# Patient Record
Sex: Female | Born: 1990 | Race: White | Hispanic: No | Marital: Married
Health system: Southern US, Community
[De-identification: ages and names within clinical notes are randomized; demographics above are authoritative.]

---

## 2013-05-10 ENCOUNTER — Other Ambulatory Visit: Payer: Self-pay

## 2013-06-11 ENCOUNTER — Other Ambulatory Visit (HOSPITAL_COMMUNITY): Payer: Self-pay | Admitting: Specialist

## 2013-06-11 DIAGNOSIS — O289 Unspecified abnormal findings on antenatal screening of mother: Secondary | ICD-10-CM

## 2013-06-14 ENCOUNTER — Encounter (HOSPITAL_COMMUNITY): Payer: Self-pay | Admitting: Specialist

## 2013-06-22 ENCOUNTER — Encounter (HOSPITAL_COMMUNITY): Payer: Self-pay

## 2013-06-22 ENCOUNTER — Ambulatory Visit (HOSPITAL_COMMUNITY)
Admission: RE | Admit: 2013-06-22 | Discharge: 2013-06-22 | Disposition: A | Discharge status: Home / Self Care | Payer: BC Managed Care – PPO | Source: Ambulatory Visit | Attending: Specialist | Admitting: Specialist

## 2013-06-22 VITALS — BP 135/89 | HR 115 | Wt 257.0 lb

## 2013-06-22 DIAGNOSIS — O289 Unspecified abnormal findings on antenatal screening of mother: Secondary | ICD-10-CM | POA: Insufficient documentation

## 2013-06-22 DIAGNOSIS — O358XX0 Maternal care for other (suspected) fetal abnormality and damage, not applicable or unspecified: Secondary | ICD-10-CM | POA: Insufficient documentation

## 2013-06-22 DIAGNOSIS — Z1389 Encounter for screening for other disorder: Secondary | ICD-10-CM | POA: Insufficient documentation

## 2013-06-22 DIAGNOSIS — Z363 Encounter for antenatal screening for malformations: Secondary | ICD-10-CM | POA: Insufficient documentation

## 2013-06-22 NOTE — ED Notes (Signed)
°  Genetic Counseling  DOB: May 25, 1990 Referring Provider: Tressie EllisBroach, William, MD Appointment Date: 06/22/2013 Attending:  Dr. Alpha GulaPaul Whitecar  [name] and her partner, Mr. Hailey Jackson, were seen for genetic counseling because of an increased risk for fetal Down syndrome based on a maternal serum Quad screen. [name]'s mother was also present.  They were counseled regarding the Quad screen result and the associated 1 in 112 risk for fetal Down syndrome.  We reviewed chromosomes, nondisjunction, and the common features and variable prognosis of Down syndrome.  In addition, we reviewed the screen adjusted reduction in risks for trisomy 18 and ONTDs.  We also discussed other explanations for a screen positive result including: a gestational dating error, differences in maternal metabolism, and normal variation. They understand that this screening is not diagnostic for Down syndrome but provides a risk assessment.  We reviewed available screening options including noninvasive prenatal screening (NIPS)/cell free fetal DNA (cffDNA) testing, and detailed ultrasound.  They were counseled that screening tests are used to modify a patient's a priori risk for aneuploidy, typically based on age. This estimate provides a pregnancy specific risk assessment. We reviewed the benefits and limitations of each option. Specifically, we discussed the conditions for which each test screens, the detection rates, and false positive rates of each. They were also counseled regarding diagnostic testing via amniocentesis. We reviewed the approximate 1 in 300-500 risk for complications for amniocentesis, including spontaneous pregnancy loss.   [name] also had a detailed ultrasound performed today.  The ultrasound report will be sent under separate cover. We discussed that there were no visualized fetal anomalies or markers suggestive of aneuploidy.  She was counseled that this can reduce the chance for Down syndrome  in the pregnancy by up to 50%.  NIPS and diagnostic testing were declined today.  [name] stated that she was not concerned about the baby having Down syndrome and did not feel a need to be more certain than we are at this time.  They understand that screening tests cannot rule out all birth defects or genetic syndromes. The patient was advised of this limitation and states she still does not want additional testing at this time.   In addition, we discussed that the DIA MoM value was atypically elevated at 4.17 MoMs.  They were counseled that elevations in DIA are associated with an increased risk for adverse outcomes including: PIH, preeclampsia, poor fetal growth, and preterm delivery. We discussed the recommendation for increased surveillance.   [name] was provided with written information regarding cystic fibrosis (CF) including the carrier frequency and incidence in the Caucasian population, the availability of carrier testing and prenatal diagnosis if indicated.  In addition, we discussed that CF is routinely screened for as part of the [place] newborn screening panel.  She declined testing today.   Both family histories were reviewed and found to be noncontributory. Without further information regarding the provided family history, an accurate genetic risk cannot be calculated. Further genetic counseling is warranted if more information is obtained.  [name] denied exposure to environmental toxins or chemical agents. She denied the use of alcohol, tobacco or street drugs. She denied significant viral illnesses during the course of her pregnancy. Her medical and surgical histories were noncontributory.   I counseled this couple for approximately 40 minutes regarding the above risks and available options.   Mady Gemmaaragh Conrad, MS,  Certified Genetic Counselor

## 2013-07-20 ENCOUNTER — Encounter (HOSPITAL_COMMUNITY): Payer: Self-pay

## 2013-07-20 ENCOUNTER — Ambulatory Visit (HOSPITAL_COMMUNITY)
Admission: RE | Admit: 2013-07-20 | Discharge: 2013-07-20 | Disposition: A | Discharge status: Home / Self Care | Payer: BC Managed Care – PPO | Source: Ambulatory Visit | Attending: Specialist | Admitting: Specialist

## 2013-07-20 DIAGNOSIS — O289 Unspecified abnormal findings on antenatal screening of mother: Secondary | ICD-10-CM | POA: Insufficient documentation

## 2013-07-20 DIAGNOSIS — Z3689 Encounter for other specified antenatal screening: Secondary | ICD-10-CM | POA: Insufficient documentation

## 2013-07-20 NOTE — Progress Notes (Signed)
[facility]  Indication: 23 yr old G1P0 at 2050w2d with increased risk of trisomy 6521 of 1:112 on quad screen and elevated inhibin of 4.17MoM for follow up ultrasound to complete anatomic survey.  Findings: 1. Single intrauterine pregnancy. 2. Estimated fetal weight is in the 26th%. 3. Anterior placenta without evidence of previa. 4. Normal amniotic fluid volume. 5. Normal transabdominal cervical length. 6. The limited anatomy survey is normal. Any anatomy not evaluated on today's exam was evaluated on the previous exam.  Recommendations: 1. Appropriate fetal growth. 2. Fetal anatomic survey is complete: 3. Abnormal quad screen: - previously counseled - declined amniocentesis and cell free fetal DNA - given elevated inhibin recommend serial fetal growth every 4-6 weeks, recommend close surveillance for the development of signs/symptoms of preeclampsia 4. Follow up in 4 weeks  Eulis FosterKristen Jaima Janney, MD

## 2013-08-11 ENCOUNTER — Other Ambulatory Visit (HOSPITAL_COMMUNITY): Payer: Self-pay | Admitting: Obstetrics and Gynecology

## 2013-08-11 DIAGNOSIS — O289 Unspecified abnormal findings on antenatal screening of mother: Secondary | ICD-10-CM

## 2013-08-17 ENCOUNTER — Other Ambulatory Visit (HOSPITAL_COMMUNITY): Payer: Self-pay | Admitting: Maternal and Fetal Medicine

## 2013-08-17 ENCOUNTER — Ambulatory Visit (HOSPITAL_COMMUNITY)
Admission: RE | Admit: 2013-08-17 | Discharge: 2013-08-17 | Disposition: A | Discharge status: Home / Self Care | Payer: BC Managed Care – PPO | Source: Ambulatory Visit | Attending: Specialist | Admitting: Specialist

## 2013-08-17 ENCOUNTER — Encounter (HOSPITAL_COMMUNITY): Payer: Self-pay

## 2013-08-17 ENCOUNTER — Other Ambulatory Visit (HOSPITAL_COMMUNITY): Payer: Self-pay | Admitting: Obstetrics and Gynecology

## 2013-08-17 DIAGNOSIS — O289 Unspecified abnormal findings on antenatal screening of mother: Secondary | ICD-10-CM | POA: Insufficient documentation

## 2013-09-15 ENCOUNTER — Ambulatory Visit (HOSPITAL_COMMUNITY)
Admission: RE | Admit: 2013-09-15 | Discharge: 2013-09-15 | Disposition: A | Discharge status: Home / Self Care | Payer: BC Managed Care – PPO | Source: Ambulatory Visit | Attending: Maternal and Fetal Medicine | Admitting: Maternal and Fetal Medicine

## 2013-09-15 DIAGNOSIS — Z3689 Encounter for other specified antenatal screening: Secondary | ICD-10-CM | POA: Insufficient documentation

## 2013-09-15 DIAGNOSIS — O289 Unspecified abnormal findings on antenatal screening of mother: Secondary | ICD-10-CM | POA: Insufficient documentation

## 2013-10-13 ENCOUNTER — Other Ambulatory Visit (HOSPITAL_COMMUNITY): Payer: Self-pay | Admitting: Maternal and Fetal Medicine

## 2013-10-13 ENCOUNTER — Encounter (HOSPITAL_COMMUNITY): Payer: Self-pay

## 2013-10-13 ENCOUNTER — Ambulatory Visit (HOSPITAL_COMMUNITY)
Admission: RE | Admit: 2013-10-13 | Discharge: 2013-10-13 | Disposition: A | Discharge status: Home / Self Care | Payer: BC Managed Care – PPO | Source: Ambulatory Visit | Attending: Specialist | Admitting: Specialist

## 2013-10-13 DIAGNOSIS — Z3689 Encounter for other specified antenatal screening: Secondary | ICD-10-CM | POA: Insufficient documentation

## 2013-10-13 DIAGNOSIS — O289 Unspecified abnormal findings on antenatal screening of mother: Secondary | ICD-10-CM | POA: Insufficient documentation

## 2014-03-07 ENCOUNTER — Encounter (HOSPITAL_COMMUNITY): Payer: Self-pay

## 2014-04-27 ENCOUNTER — Encounter (HOSPITAL_COMMUNITY): Payer: Self-pay | Admitting: *Deleted

## 2015-12-07 IMAGING — US US OB FOLLOW-UP
1 series · 12 of 28 positions shown · non-contrast
Comparison: none

OBSTETRICS REPORT
                      (Signed Final 07/20/2013 [DATE])

Service(s) Provided
 US OB FOLLOW UP                                       76816.1
Indications
 Follow-up incomplete fetal anatomic evaluation
 Abnormal biochemical screen (quad) for Trisomy
 21 [DATE]
Fetal Evaluation
 Num Of Fetuses:    1
 Fetal Heart Rate:  147                          bpm
 Cardiac Activity:  Observed
 Presentation:      Cephalic
 Placenta:          Anterior, above cervical os
 P. Cord            Visualized
 Insertion:
 Amniotic Fluid
 AFI FV:      Subjectively within normal limits
                                             Larg Pckt:     5.5  cm
Biometry
 BPD:     50.9  mm     G. Age:  21w 3d                CI:         76.0   70 - 86
 OFD:       67  mm                                    FL/HC:      18.5   18.4 -
 HC:     190.7  mm     G. Age:  21w 3d        9  %    HC/AC:      1.14   1.06 -
 AC:     167.5  mm     G. Age:  21w 5d       26  %    FL/BPD:     69.4   71 - 87
 FL:      35.3  mm     G. Age:  21w 1d       10  %    FL/AC:      21.1   20 - 24
 HUM:     33.9  mm     G. Age:  21w 4d       29  %
 Est. FW:     426  gm    0 lb 15 oz      26  %
Gestational Age
 LMP:           22w 2d        Date:  02/14/13                 EDD:   11/21/13
 U/S Today:     21w 3d                                        EDD:   11/27/13
 Best:          22w 2d     Det. By:  LMP  (02/14/13)          EDD:   11/21/13
Anatomy
 Cranium:          Appears normal         Aortic Arch:      Appears normal
 Fetal Cavum:      Previously seen        Ductal Arch:      Appears normal
 Ventricles:       Appears normal         Diaphragm:        Previously seen
 Choroid Plexus:   Previously seen        Stomach:          Appears normal, left
                                                            sided
 Cerebellum:       Previously seen        Abdomen:          Appears normal
 Posterior Fossa:  Previously seen        Abdominal Wall:   Appears nml (cord
                                                            insert, abd wall)
 Nuchal Fold:      Previously seen        Cord Vessels:     Previously seen
 Face:             Orbits and profile     Kidneys:          Appear normal
                   previously seen
 Lips:             Previously seen        Bladder:          Appears normal
 Heart:            Appears normal         Spine:            Previously seen
                   (4CH, axis, and
                   situs)
 RVOT:             Appears normal         Lower             Previously seen
                                          Extremities:
 LVOT:             Appears normal         Upper             Previously seen
 Other:  Fetus appears to be a female.Heels and 5th digit appear normal.
Cervix Uterus Adnexa
 Cervical Length:    3.2      cm
 Cervix:       Normal appearance by transabdominal scan.
 Uterus:       No abnormality visualized.
 Left Ovary:    No adnexal mass visualized.
 Right Ovary:   No adnexal mass visualized.
 Adnexa:     No abnormality visualized.
Impression
INDICATION: 22 yr old G1P0 at 00w0d with increased risk of
 trisomy 2[REDACTED] on quad screen and elevated inhibin of
 U.SBBoB for follow up ultrasound to complete anatomic
 survey.

[Series 1: us ob follow-up · 0.29mm/px · 58 acquisitions, 12 frames shown]
[im 3/58]
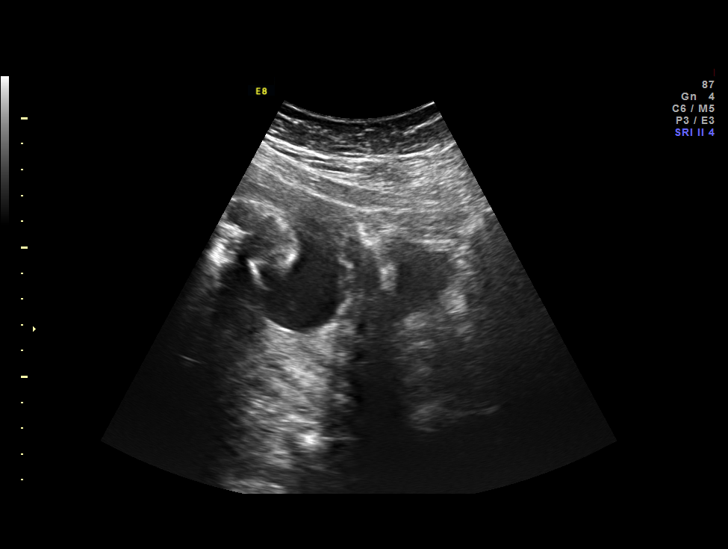
[im 7/58]
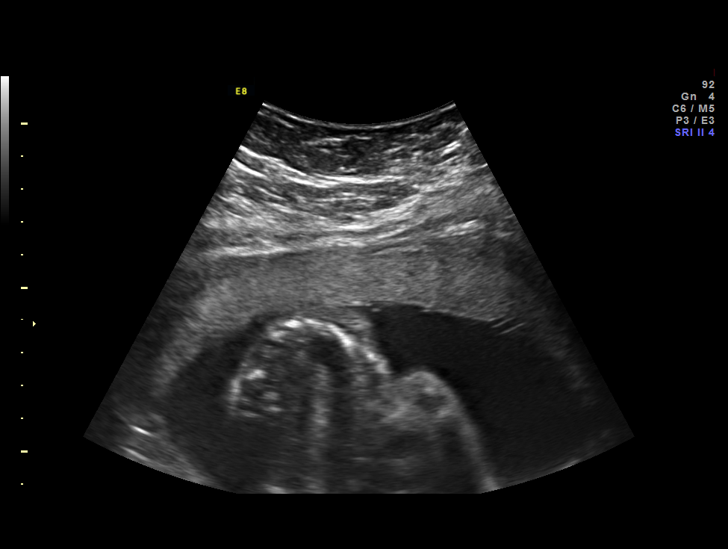
[im 11/58]
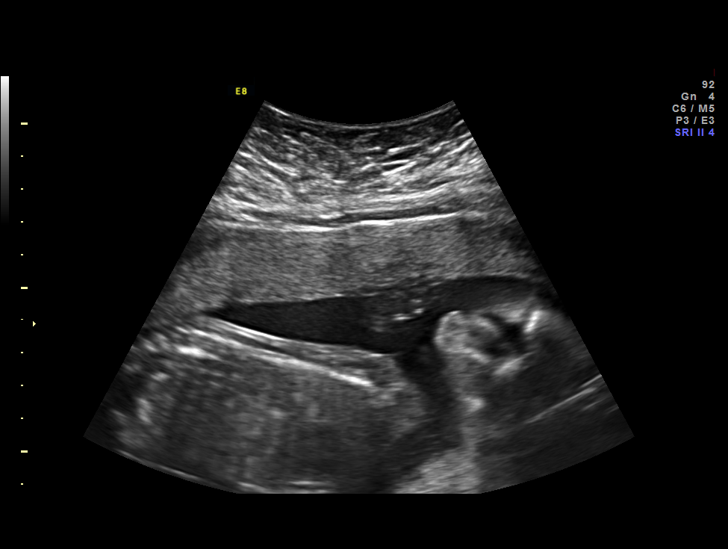
[im 17/58]
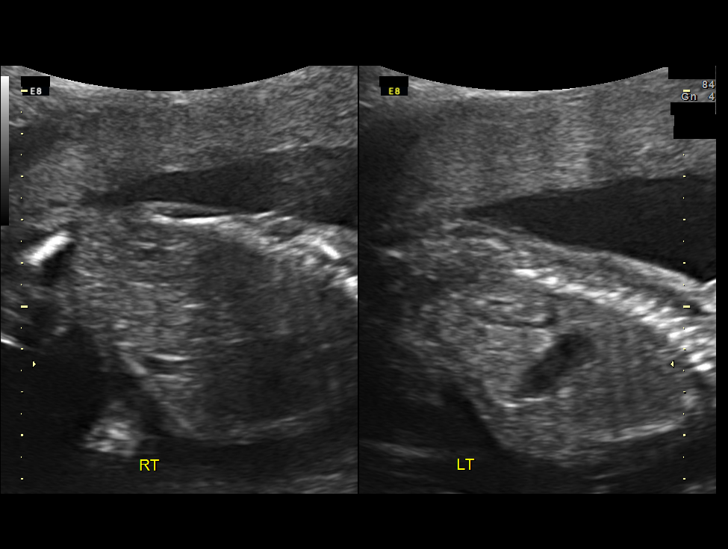
[im 22/58]
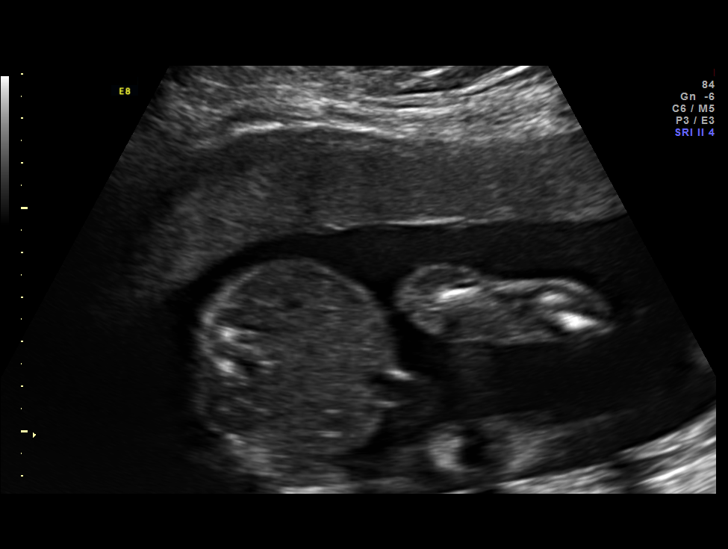
[im 26/58]
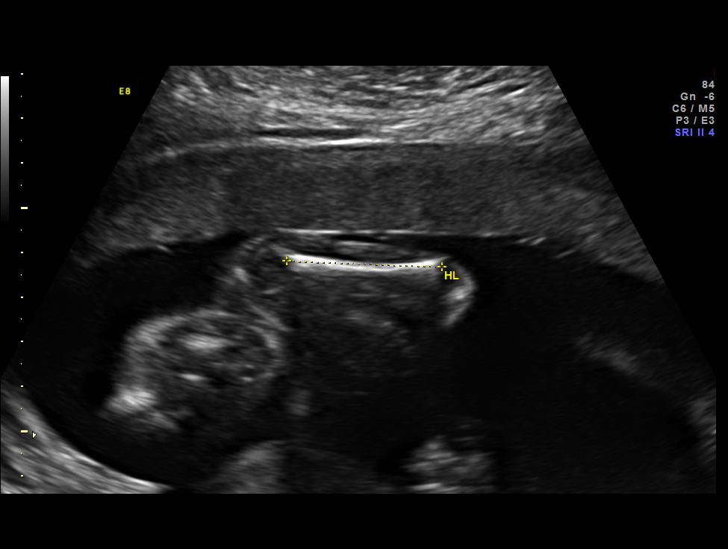
[im 32/58]
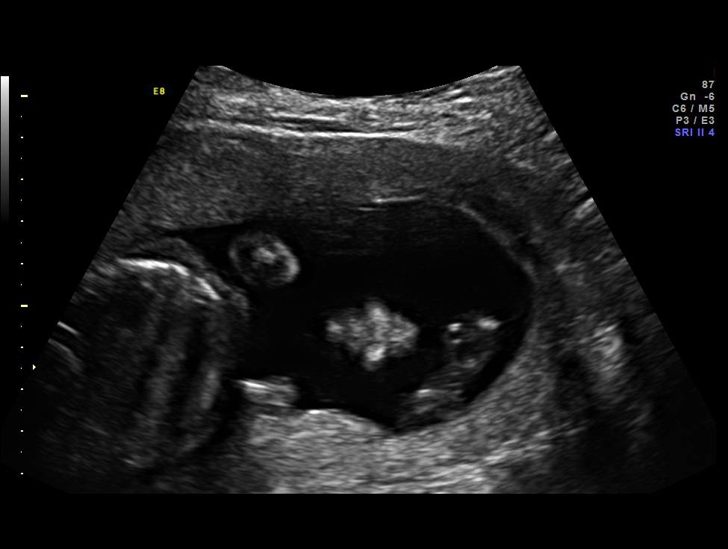
[im 36/58]
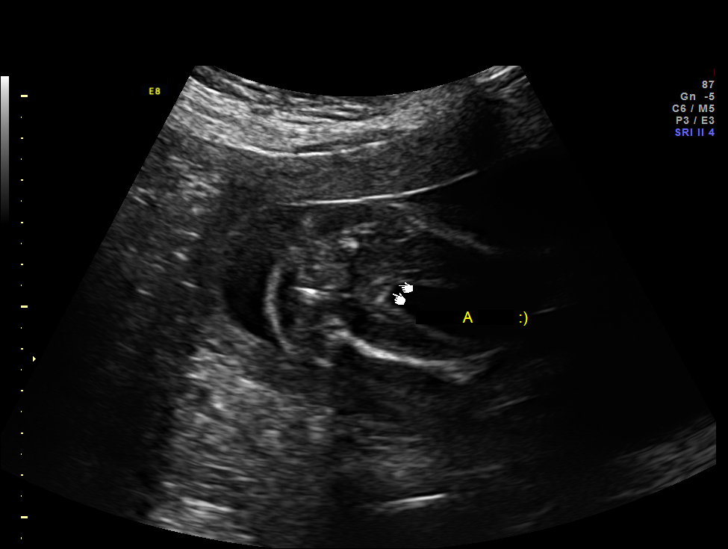
[im 41/58]
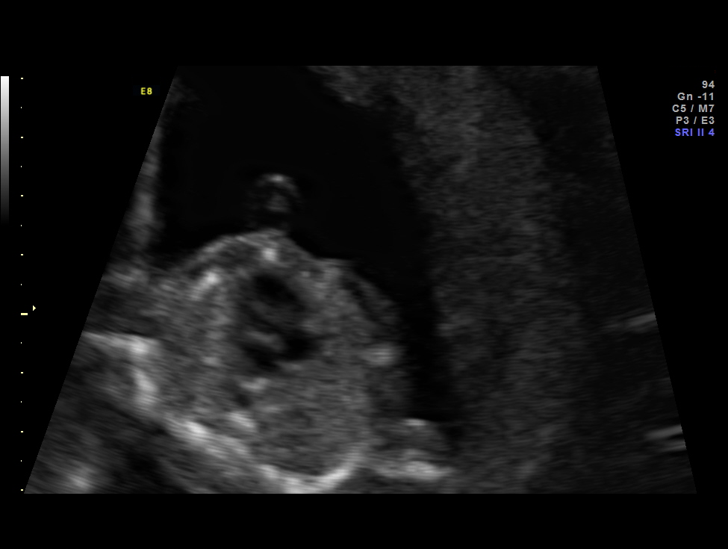
[im 47/58]
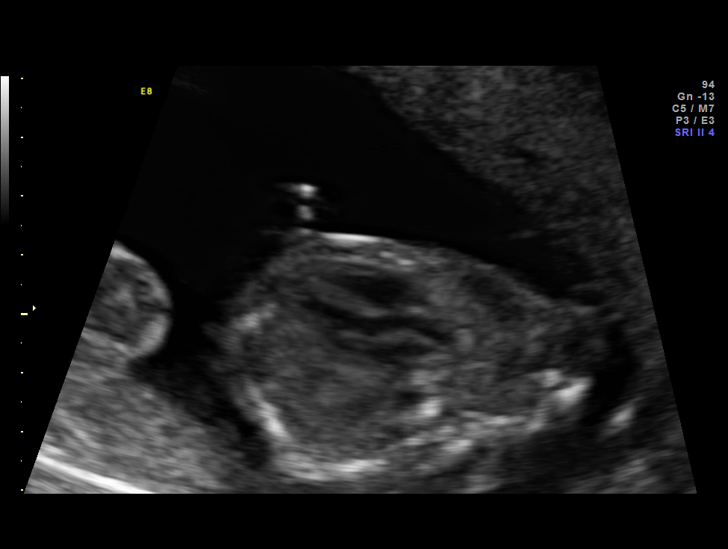
[im 51/58]
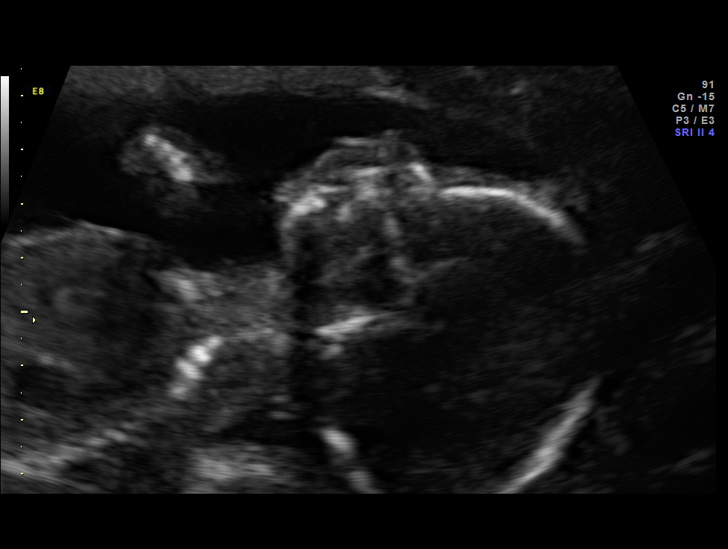
[im 55/58]
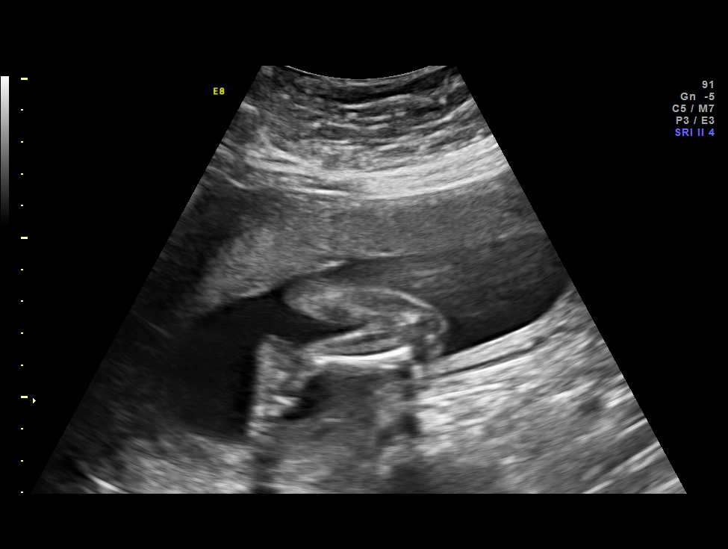

[12 of 28 positions shown; findings below may reference images not displayed]

FINDINGS: 1. Single intrauterine pregnancy.
 2. Estimated fetal weight is in the 26th%.
 3. Anterior placenta without evidence of previa.
 4. Normal amniotic fluid volume.
 5. Normal transabdominal cervical length.
 6. The limited anatomy survey is normal. Any anatomy not
 evaluated on today's exam was evaluated on the previous
 exam.
Recommendations

 1. Appropriate fetal growth.
 2. Fetal anatomic survey is complete:
 3. Abnormal quad screen:
 - previously counseled
 - declined amniocentesis and cell free fetal DNA
 - given elevated inhibin recommend serial fetal growth every
 4-6 weeks, recommend close surveillance for the
 development of signs/symptoms of preeclampsia
 4. Follow up in 4 weeks
 questions or concerns.

## 2016-01-04 IMAGING — US US OB FOLLOW-UP
1 series · 12 of 28 positions shown · non-contrast
Comparison: none

[Series 1: us ob follow-up · 12 of 40 slices shown]
[im 2/40]
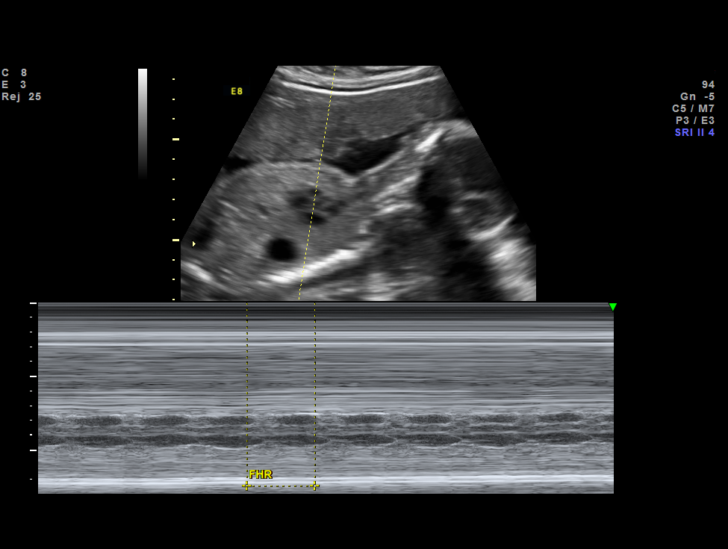
[im 5/40]
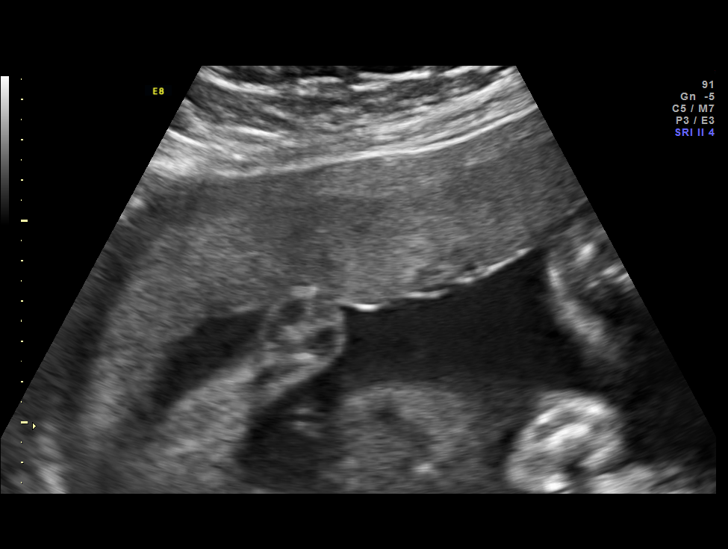
[im 8/40]
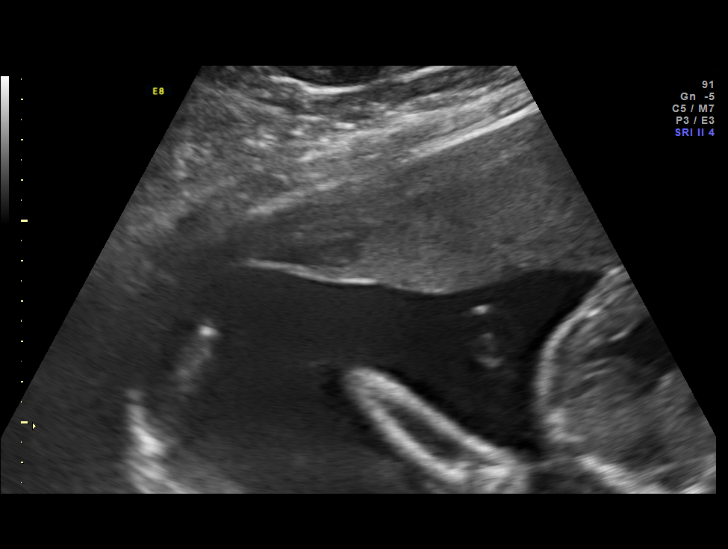
[im 12/40]
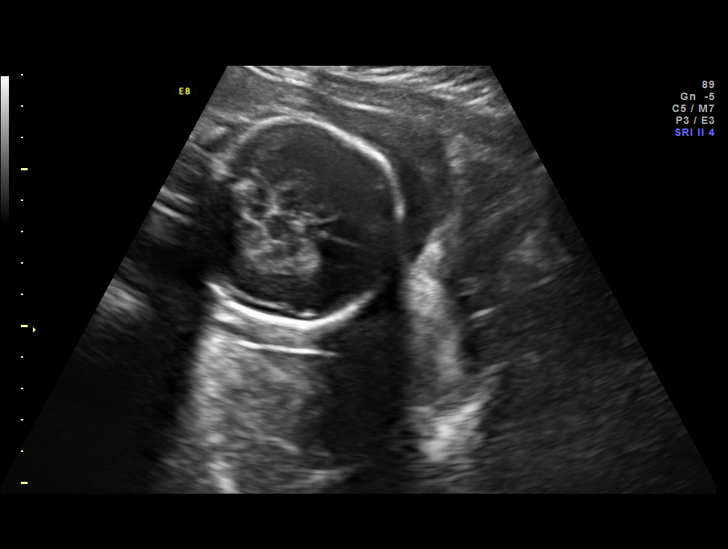
[im 15/40]
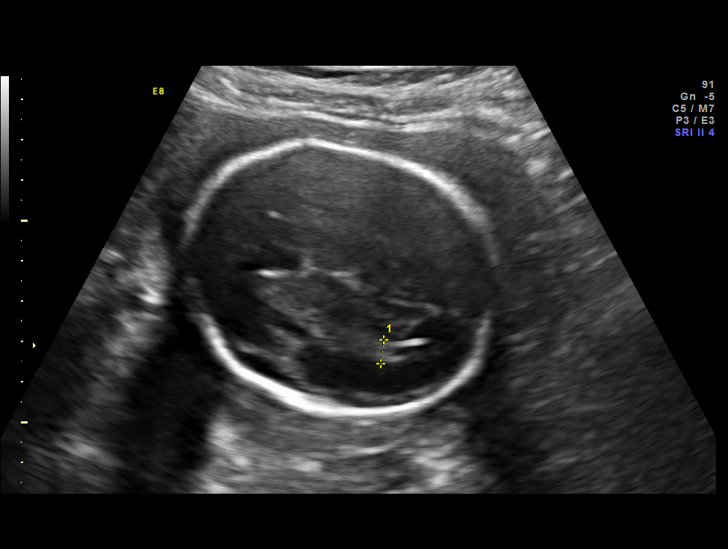
[im 18/40]
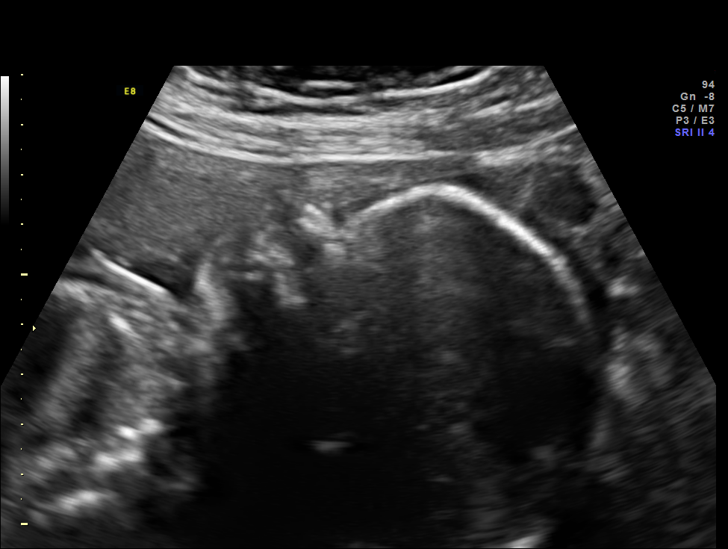
[im 22/40]
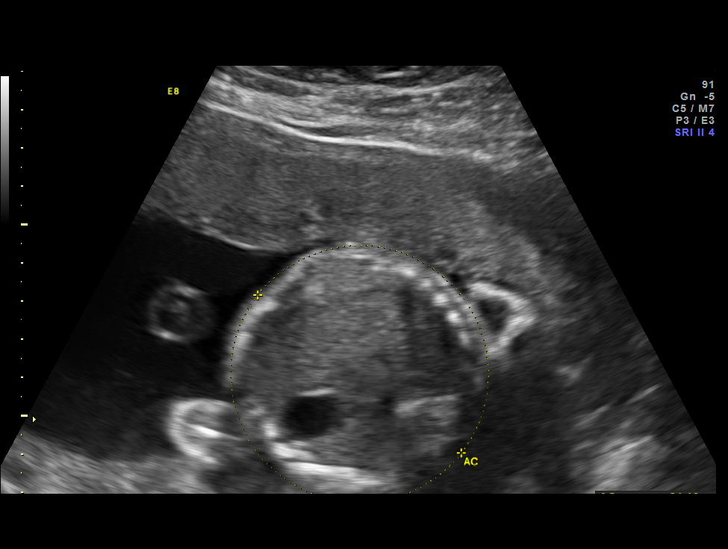
[im 25/40]
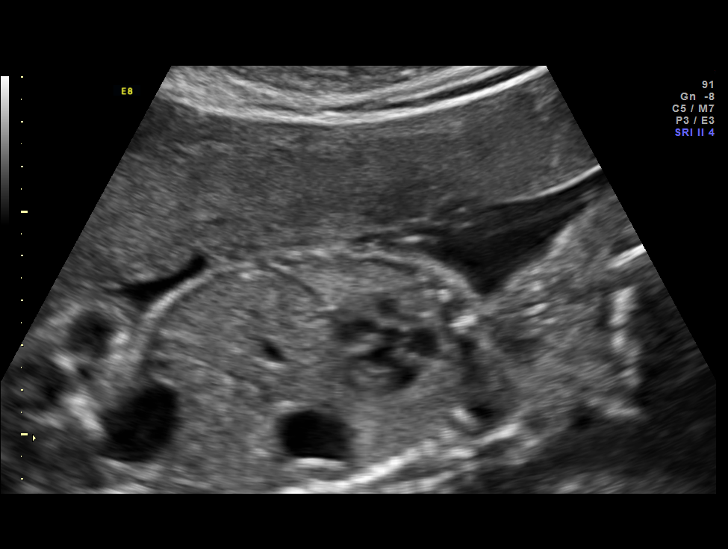
[im 28/40]
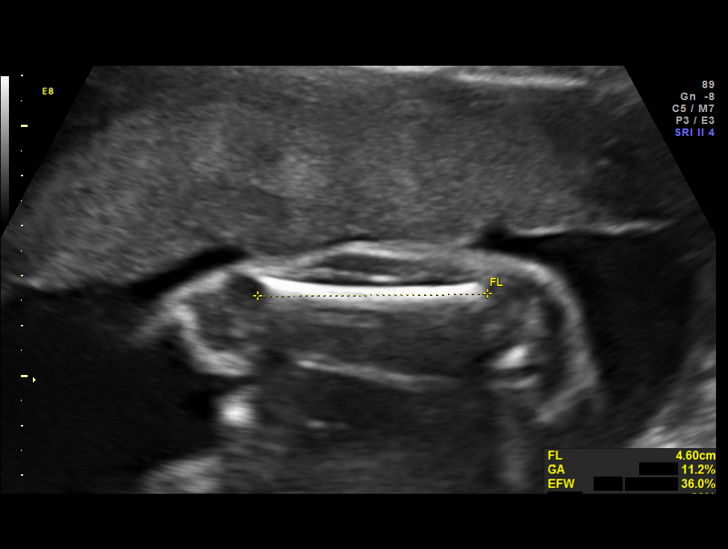
[im 32/40]
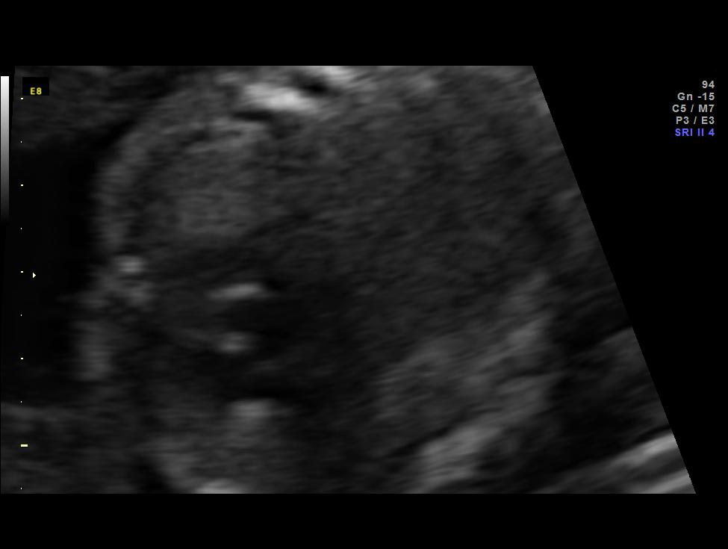
[im 35/40]
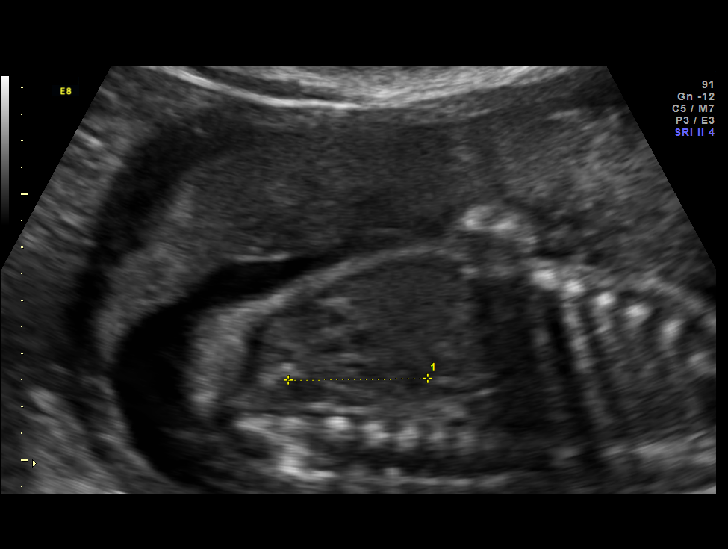
[im 38/40]
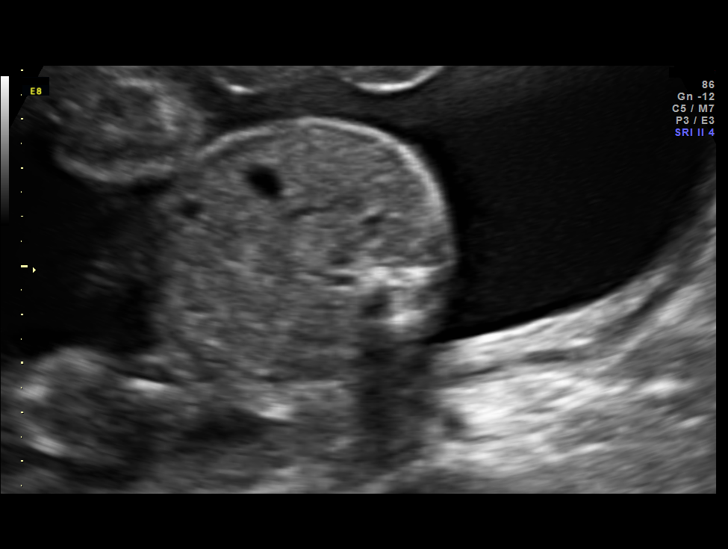

[12 of 28 positions shown; findings below may reference images not displayed]

OBSTETRICS REPORT
                      (Signed Final 08/17/2013 [DATE])

Service(s) Provided

 US OB FOLLOW UP                                       76816.1
Indications

 Abnormal biochemical screen (quad) for Trisomy
 21 [DATE]; declined further testing
 Inhibin MoM =
Fetal Evaluation

 Num Of Fetuses:    1
 Fetal Heart Rate:  148                          bpm
 Cardiac Activity:  Observed
 Presentation:      Cephalic
 Placenta:          Anterior, above cervical os
 P. Cord            Visualized, central
 Insertion:

 Amniotic Fluid
 AFI FV:      Subjectively within normal limits
                                             Larg Pckt:     6.2  cm
Biometry

 BPD:     64.3  mm     G. Age:  26w 0d                CI:         77.8   70 - 86
 OFD:     82.7  mm                                    FL/HC:      19.7   18.6 -

 HC:       236  mm     G. Age:  25w 4d       21  %    HC/AC:      1.13   1.04 -

 AC:     209.7  mm     G. Age:  25w 4d       31  %    FL/BPD:     72.2   71 - 87
 FL:      46.4  mm     G. Age:  25w 3d       24  %    FL/AC:      22.1   20 - 24

 Est. FW:     820  gm    1 lb 13 oz      47  %
Gestational Age

 LMP:           26w 2d        Date:  02/14/13                 EDD:   11/21/13
 U/S Today:     25w 4d                                        EDD:   11/26/13
 Best:          25w 6d     Det. By:  Early Ultrasound         EDD:   11/24/13
                                     (05/10/13)
Anatomy

 Cranium:          Appears normal         Aortic Arch:      Previously seen
 Fetal Cavum:      Previously seen        Ductal Arch:      Previously seen
 Ventricles:       Appears normal         Diaphragm:        Previously seen
 Choroid Plexus:   Previously seen        Stomach:          Appears normal, left
                                                            sided
 Cerebellum:       Previously seen        Abdomen:          Appears normal
 Posterior Fossa:  Previously seen        Abdominal Wall:   Previously seen
 Nuchal Fold:      Previously seen        Cord Vessels:     Previously seen
 Face:             Orbits and profile     Kidneys:          Appear normal
                   previously seen
 Lips:             Previously seen        Bladder:          Appears normal
 Heart:            Previously seen        Spine:            Previously seen
 RVOT:             Appears normal         Lower             Previously seen
                                          Extremities:
 LVOT:             Previously seen        Upper             Previously seen
                                          Extremities:

 Other:  Fetus appears to be a female. Heels and 5th digit previously seen.
Cervix Uterus Adnexa

 Cervical Length:    4.3      cm

 Cervix:       Normal appearance by transabdominal scan.
Impression

 SIUP at 25+6 weeks
 Normal interval anatomy; anatomic survey complete
 Normal amniotic fluid volume
 Appropriate interval growth with EFW at the 47th %tile
Recommendations

 Serial ultrasounds for growth at 30 and 34 weeks due to
 elevated inhibin

 questions or concerns.
